# Patient Record
Sex: Male | Born: 1980 | Race: White | Hispanic: No | Marital: Married | Smoking: Light tobacco smoker
Health system: Southern US, Community
[De-identification: ages and names within clinical notes are randomized; demographics above are authoritative.]

## PROBLEM LIST (undated history)

## (undated) DIAGNOSIS — G4730 Sleep apnea, unspecified: Secondary | ICD-10-CM

## (undated) DIAGNOSIS — F32A Depression, unspecified: Secondary | ICD-10-CM

## (undated) DIAGNOSIS — R569 Unspecified convulsions: Secondary | ICD-10-CM

## (undated) DIAGNOSIS — F419 Anxiety disorder, unspecified: Secondary | ICD-10-CM

## (undated) DIAGNOSIS — K219 Gastro-esophageal reflux disease without esophagitis: Secondary | ICD-10-CM

## (undated) DIAGNOSIS — F329 Major depressive disorder, single episode, unspecified: Secondary | ICD-10-CM

## (undated) HISTORY — DX: Depression, unspecified: F32.A

## (undated) HISTORY — PX: OTHER SURGICAL HISTORY: SHX169

## (undated) HISTORY — DX: Major depressive disorder, single episode, unspecified: F32.9

## (undated) HISTORY — DX: Anxiety disorder, unspecified: F41.9

## (undated) HISTORY — DX: Gastro-esophageal reflux disease without esophagitis: K21.9

## (undated) HISTORY — DX: Unspecified convulsions: R56.9

## (undated) HISTORY — DX: Sleep apnea, unspecified: G47.30

---

## 2001-07-09 ENCOUNTER — Emergency Department (HOSPITAL_COMMUNITY): Admission: EM | Admit: 2001-07-09 | Discharge: 2001-07-09 | Payer: Self-pay | Admitting: Emergency Medicine

## 2001-07-09 ENCOUNTER — Encounter: Payer: Self-pay | Admitting: Emergency Medicine

## 2002-11-09 ENCOUNTER — Emergency Department (HOSPITAL_COMMUNITY): Admission: EM | Admit: 2002-11-09 | Discharge: 2002-11-09 | Payer: Self-pay | Admitting: Emergency Medicine

## 2002-11-09 ENCOUNTER — Encounter: Payer: Self-pay | Admitting: Emergency Medicine

## 2002-11-11 ENCOUNTER — Emergency Department (HOSPITAL_COMMUNITY): Admission: EM | Admit: 2002-11-11 | Discharge: 2002-11-11 | Payer: Self-pay | Admitting: Emergency Medicine

## 2006-08-09 ENCOUNTER — Emergency Department (HOSPITAL_COMMUNITY): Admission: EM | Admit: 2006-08-09 | Discharge: 2006-08-09 | Payer: Self-pay | Admitting: Emergency Medicine

## 2006-12-24 ENCOUNTER — Ambulatory Visit (HOSPITAL_COMMUNITY): Payer: Self-pay | Admitting: Psychiatry

## 2008-03-02 ENCOUNTER — Ambulatory Visit (HOSPITAL_COMMUNITY): Payer: Self-pay | Admitting: Psychiatry

## 2009-09-13 ENCOUNTER — Ambulatory Visit (HOSPITAL_COMMUNITY): Payer: Self-pay | Admitting: Psychiatry

## 2010-02-12 ENCOUNTER — Ambulatory Visit: Payer: Self-pay | Admitting: Psychiatry

## 2013-08-13 ENCOUNTER — Emergency Department (HOSPITAL_COMMUNITY): Payer: Self-pay

## 2013-08-13 ENCOUNTER — Emergency Department (HOSPITAL_COMMUNITY)
Admission: EM | Admit: 2013-08-13 | Discharge: 2013-08-13 | Disposition: A | Discharge status: Home / Self Care | Payer: Self-pay | Attending: Emergency Medicine | Admitting: Emergency Medicine

## 2013-08-13 DIAGNOSIS — R1084 Generalized abdominal pain: Secondary | ICD-10-CM | POA: Insufficient documentation

## 2013-08-13 DIAGNOSIS — R109 Unspecified abdominal pain: Secondary | ICD-10-CM

## 2013-08-13 DIAGNOSIS — R112 Nausea with vomiting, unspecified: Secondary | ICD-10-CM | POA: Insufficient documentation

## 2013-08-13 LAB — RAPID URINE DRUG SCREEN, HOSP PERFORMED
Amphetamines: NOT DETECTED
Barbiturates: NOT DETECTED
Benzodiazepines: NOT DETECTED
Cocaine: NOT DETECTED
Tetrahydrocannabinol: POSITIVE — AB

## 2013-08-13 LAB — CBC WITH DIFFERENTIAL/PLATELET
Eosinophils Absolute: 0 10*3/uL (ref 0.0–0.7)
Lymphocytes Relative: 15 % (ref 12–46)
Lymphs Abs: 1.8 10*3/uL (ref 0.7–4.0)
MCH: 30.2 pg (ref 26.0–34.0)
Neutrophils Relative %: 76 % (ref 43–77)
Platelets: 277 10*3/uL (ref 150–400)
RBC: 5.53 MIL/uL (ref 4.22–5.81)
WBC: 11.6 10*3/uL — ABNORMAL HIGH (ref 4.0–10.5)

## 2013-08-13 LAB — COMPREHENSIVE METABOLIC PANEL
ALT: 37 U/L (ref 0–53)
AST: 33 U/L (ref 0–37)
Alkaline Phosphatase: 82 U/L (ref 39–117)
CO2: 24 mEq/L (ref 19–32)
Calcium: 10.4 mg/dL (ref 8.4–10.5)
Creatinine, Ser: 1.16 mg/dL (ref 0.50–1.35)
GFR calc Af Amer: >90 mL/min (ref >90)
Glucose, Bld: 129 mg/dL — ABNORMAL HIGH (ref 70–99)
Potassium: 2.9 mEq/L — ABNORMAL LOW (ref 3.5–5.1)
Sodium: 137 mEq/L (ref 135–145)
Total Protein: 8.5 g/dL — ABNORMAL HIGH (ref 6.0–8.3)

## 2013-08-13 LAB — URINALYSIS, ROUTINE W REFLEX MICROSCOPIC
Bilirubin Urine: NEGATIVE
Glucose, UA: NEGATIVE mg/dL
Hgb urine dipstick: NEGATIVE
Protein, ur: NEGATIVE mg/dL
Specific Gravity, Urine: 1.019 (ref 1.005–1.030)
pH: 7.5 (ref 5.0–8.0)

## 2013-08-13 MED ORDER — IOHEXOL 300 MG/ML  SOLN
50.0000 mL | Freq: Once | INTRAMUSCULAR | Status: AC | PRN
  Administered 2013-08-13: 50 mL via ORAL

## 2013-08-13 MED ORDER — PANTOPRAZOLE SODIUM 40 MG IV SOLR
40.0000 mg | Freq: Once | INTRAVENOUS | Status: AC
  Administered 2013-08-13: 40 mg via INTRAVENOUS
  Filled 2013-08-13 (×2): qty 40

## 2013-08-13 MED ORDER — LORAZEPAM 2 MG/ML IJ SOLN
1.0000 mg | Freq: Once | INTRAMUSCULAR | Status: AC
  Administered 2013-08-13: 1 mg via INTRAVENOUS
  Filled 2013-08-13: qty 1

## 2013-08-13 MED ORDER — PANTOPRAZOLE SODIUM 20 MG PO TBEC: 20.0000 mg | DELAYED_RELEASE_TABLET | Freq: Every day | ORAL | Status: DC

## 2013-08-13 MED ORDER — ONDANSETRON HCL 4 MG/2ML IJ SOLN
4.0000 mg | Freq: Once | INTRAMUSCULAR | Status: AC
  Administered 2013-08-13: 4 mg via INTRAVENOUS
  Filled 2013-08-13: qty 2

## 2013-08-13 MED ORDER — SODIUM CHLORIDE 0.9 % IV BOLUS (SEPSIS)
1000.0000 mL | Freq: Once | INTRAVENOUS | Status: AC
  Administered 2013-08-13: 1000 mL via INTRAVENOUS

## 2013-08-13 MED ORDER — POTASSIUM CHLORIDE 10 MEQ/100ML IV SOLN
10.0000 meq | Freq: Once | INTRAVENOUS | Status: AC
  Administered 2013-08-13: 10 meq via INTRAVENOUS
  Filled 2013-08-13: qty 100

## 2013-08-13 MED ORDER — SODIUM CHLORIDE 0.9 % IV SOLN
1000.0000 mL | Freq: Once | INTRAVENOUS | Status: AC
  Administered 2013-08-13: 1000 mL via INTRAVENOUS

## 2013-08-13 MED ORDER — SUCRALFATE 1 G PO TABS: 1.0000 g | ORAL_TABLET | Freq: Four times a day (QID) | ORAL | Status: DC

## 2013-08-13 MED ORDER — PANTOPRAZOLE SODIUM 40 MG IV SOLR: 40.0000 mg | Freq: Once | INTRAVENOUS | Status: DC

## 2013-08-13 MED ORDER — IOHEXOL 300 MG/ML  SOLN
80.0000 mL | Freq: Once | INTRAMUSCULAR | Status: AC | PRN
  Administered 2013-08-13: 80 mL via INTRAVENOUS

## 2013-08-13 MED ORDER — LORAZEPAM 1 MG PO TABS: 1.0000 mg | ORAL_TABLET | Freq: Three times a day (TID) | ORAL | Status: DC | PRN

## 2013-08-13 MED ORDER — ONDANSETRON 8 MG PO TBDP: 8.0000 mg | ORAL_TABLET | Freq: Three times a day (TID) | ORAL | Status: DC | PRN

## 2013-08-13 NOTE — ED Notes (Signed)
Pt aware of need for urine  

## 2013-08-13 NOTE — ED Notes (Signed)
Pt start having vomiting constantly for 3 days. Anxious not sleeping urgent care yesterday. No releif

## 2013-08-13 NOTE — ED Notes (Signed)
Pt reminded of need for urine 

## 2013-08-13 NOTE — ED Provider Notes (Addendum)
CSN: [id number]     Arrival date & time 08/13/13  1123 History   First MD Initiated Contact with Patient 08/13/13 1208     Chief Complaint  Patient presents with  . Emesis   (Consider location/radiation/quality/duration/timing/severity/associated sxs/prior Treatment) Patient is a 32 y.o. male presenting with vomiting. The history is provided by the patient.  Emesis  patient complains of 3 days of nonbilious emesis with associated nausea and abdominal cramping characterized as diffuse. Denies any fever or chills. No diarrhea noted. No pressure same. Denies any urinary symptoms. Symptoms began spontaneously and nothing makes them better or worse. No treatment used prior to arrival. He does note increased anxiety.  No past medical history on file. No past surgical history on file. No family history on file. History  Substance Use Topics  . Smoking status: Not on file  . Smokeless tobacco: Not on file  . Alcohol Use: Not on file    Review of Systems  Gastrointestinal: Positive for vomiting.  All other systems reviewed and are negative.    Allergies  Review of patient's allergies indicates no known allergies.  Home Medications   Current Outpatient Rx  Name  Route  Sig  Dispense  Refill  . bismuth subsalicylate (PEPTO BISMOL) 262 MG/15ML suspension   Oral   Take 30 mLs by mouth every 6 (six) hours as needed for indigestion.         . diphenhydrAMINE (BENADRYL) 25 MG tablet   Oral   Take 25 mg by mouth every 6 (six) hours as needed for allergies.         Marland Kitchen PRESCRIPTION MEDICATION   Oral   Take 1 tablet by mouth every 6 (six) hours as needed (nausea). Nausea medication          BP 133/96  Pulse 93  Temp(Src) 97.5 F (36.4 C) (Oral)  Resp 22  SpO2 99% Physical Exam  Nursing note and vitals reviewed. Constitutional: He is oriented to person, place, and time. He appears well-developed and well-nourished.  Non-toxic appearance. No distress.  HENT:  Head:  Normocephalic and atraumatic.  Eyes: Conjunctivae, EOM and lids are normal. Pupils are equal, round, and reactive to light.  Neck: Normal range of motion. Neck supple. No tracheal deviation present. No mass present.  Cardiovascular: Normal rate, regular rhythm and normal heart sounds.  Exam reveals no gallop.   No murmur heard. Pulmonary/Chest: Effort normal and breath sounds normal. No stridor. No respiratory distress. He has no decreased breath sounds. He has no wheezes. He has no rhonchi. He has no rales.  Abdominal: Soft. Normal appearance and bowel sounds are normal. He exhibits no distension. There is generalized tenderness. There is no rigidity, no rebound, no guarding and no CVA tenderness.  Musculoskeletal: Normal range of motion. He exhibits no edema and no tenderness.  Neurological: He is alert and oriented to person, place, and time. He has normal strength. No cranial nerve deficit or sensory deficit. GCS eye subscore is 4. GCS verbal subscore is 5. GCS motor subscore is 6.  Skin: Skin is warm and dry. No abrasion and no rash noted.  Psychiatric: His behavior is normal. His mood appears anxious. His speech is rapid and/or pressured.    ED Course  Procedures (including critical care time) Labs Review Labs Reviewed  CBC WITH DIFFERENTIAL  COMPREHENSIVE METABOLIC PANEL  LIPASE, BLOOD  URINALYSIS, ROUTINE W REFLEX MICROSCOPIC   Imaging Review No results found.  EKG Interpretation   None  MDM  No diagnosis found. Patient given IV fluids along with antibiotics Ativan. He feels better at this time. Patient relates symptoms of gastritis and will be treated as such  4:24 PM Spoke with family and discussed need for f/u--pt given meds here for nausea/vomiting, no surgical emergency, suspect PUD with anxiety--will also tx with protonix iv  Toy Baker, MD 08/13/13 1545  Toy Baker, MD 08/13/13 1626

## 2013-08-13 NOTE — Progress Notes (Signed)
   CARE MANAGEMENT ED NOTE 08/13/2013  Patient:  Jose Romero, Jose Romero   Account Number:  [id number]  Date Initiated:  08/13/2013  Documentation initiated by:  Edd Arbour  Subjective/Objective Assessment:   32 year old self pay [place] pt who confirmed with CM he does not have a pcp UDS positive for marijuana K 2.9 wbc 11.6 abdominal xray/CT abdomen without abnormalities VSS     Subjective/Objective Assessment Detail:     Action/Plan:   CM spoke with pt early in ED visit but he was noted shaking so blankets x 2 provided as his ED RN continued to evaluate the pt Cm return to see pt and provided information/resources to mother at bedside see notes below   Action/Plan Detail:   Anticipated DC Date:  08/13/2013     Status Recommendation to Physician:   Result of Recommendation:    Other ED Services  Consult Working Plan    DC Planning Services  PCP issues  Outpatient Services - Pt will follow up  Other    Choice offered to / List presented to:            Status of service:  Completed, signed off  ED Comments:   ED Comments Detail:  CM spoke with pt who confirms self pay [facility] resident with no pcp. CM discussed and provided written information for self pay pcps, importance of pcp for f/u care, www.needymeds.org, discounted pharmacies and other [place] resources such as financial assistance, DSS and  health department  Reviewed resources for [employer] self pay pcps like [facility], family medicine at [address], [facility] family practice, general medical clinics, [facility] urgent care plus others, [facility] out patient pharmacies and housing Pt voiced understanding and appreciation of resources provided

## 2014-04-26 ENCOUNTER — Emergency Department (HOSPITAL_COMMUNITY)
Admission: EM | Admit: 2014-04-26 | Discharge: 2014-04-26 | Disposition: A | Discharge status: Home / Self Care | Payer: BC Managed Care – PPO | Attending: Emergency Medicine | Admitting: Emergency Medicine

## 2014-04-26 ENCOUNTER — Ambulatory Visit (HOSPITAL_COMMUNITY): Admission: RE | Admit: 2014-04-26 | Payer: BC Managed Care – PPO | Source: Ambulatory Visit

## 2014-04-26 ENCOUNTER — Emergency Department (HOSPITAL_COMMUNITY): Payer: BC Managed Care – PPO

## 2014-04-26 ENCOUNTER — Ambulatory Visit (HOSPITAL_COMMUNITY): Payer: BC Managed Care – PPO

## 2014-04-26 ENCOUNTER — Encounter (HOSPITAL_COMMUNITY): Payer: Self-pay | Admitting: Emergency Medicine

## 2014-04-26 DIAGNOSIS — R5383 Other fatigue: Secondary | ICD-10-CM

## 2014-04-26 DIAGNOSIS — R112 Nausea with vomiting, unspecified: Secondary | ICD-10-CM

## 2014-04-26 DIAGNOSIS — R5381 Other malaise: Secondary | ICD-10-CM | POA: Diagnosis not present

## 2014-04-26 DIAGNOSIS — Z79899 Other long term (current) drug therapy: Secondary | ICD-10-CM | POA: Diagnosis not present

## 2014-04-26 DIAGNOSIS — R109 Unspecified abdominal pain: Secondary | ICD-10-CM | POA: Insufficient documentation

## 2014-04-26 LAB — COMPREHENSIVE METABOLIC PANEL
ALT: 50 U/L (ref 0–53)
AST: 28 U/L (ref 0–37)
Albumin: 4.9 g/dL (ref 3.5–5.2)
Alkaline Phosphatase: 79 U/L (ref 39–117)
Anion gap: 21 — ABNORMAL HIGH (ref 5–15)
BUN: 17 mg/dL (ref 6–23)
CALCIUM: 10 mg/dL (ref 8.4–10.5)
CO2: 20 meq/L (ref 19–32)
Chloride: 100 mEq/L (ref 96–112)
Creatinine, Ser: 1.05 mg/dL (ref 0.50–1.35)
GLUCOSE: 146 mg/dL — AB (ref 70–99)
Potassium: 3.4 mEq/L — ABNORMAL LOW (ref 3.7–5.3)
SODIUM: 141 meq/L (ref 137–147)
Total Bilirubin: 1.1 mg/dL (ref 0.3–1.2)
Total Protein: 8.1 g/dL (ref 6.0–8.3)

## 2014-04-26 LAB — RAPID URINE DRUG SCREEN, HOSP PERFORMED
Amphetamines: NOT DETECTED
Barbiturates: NOT DETECTED
Benzodiazepines: NOT DETECTED
Cocaine: NOT DETECTED
Opiates: NOT DETECTED
Tetrahydrocannabinol: POSITIVE — AB

## 2014-04-26 LAB — URINALYSIS, ROUTINE W REFLEX MICROSCOPIC
Glucose, UA: NEGATIVE mg/dL
HGB URINE DIPSTICK: NEGATIVE
KETONES UR: 15 mg/dL — AB
LEUKOCYTES UA: NEGATIVE
Nitrite: NEGATIVE
PROTEIN: 30 mg/dL — AB
Specific Gravity, Urine: 1.03 (ref 1.005–1.030)
Urobilinogen, UA: 1 mg/dL (ref 0.0–1.0)
pH: 6.5 (ref 5.0–8.0)

## 2014-04-26 LAB — CBC WITH DIFFERENTIAL/PLATELET
Basophils Absolute: 0 10*3/uL (ref 0.0–0.1)
Basophils Relative: 0 % (ref 0–1)
EOS PCT: 0 % (ref 0–5)
Eosinophils Absolute: 0 10*3/uL (ref 0.0–0.7)
HCT: 47 % (ref 39.0–52.0)
HEMOGLOBIN: 16.4 g/dL (ref 13.0–17.0)
LYMPHS ABS: 1.5 10*3/uL (ref 0.7–4.0)
LYMPHS PCT: 10 % — AB (ref 12–46)
MCH: 29.3 pg (ref 26.0–34.0)
MCHC: 34.9 g/dL (ref 30.0–36.0)
MCV: 84.1 fL (ref 78.0–100.0)
MONOS PCT: 4 % (ref 3–12)
Monocytes Absolute: 0.6 10*3/uL (ref 0.1–1.0)
Neutro Abs: 12.4 10*3/uL — ABNORMAL HIGH (ref 1.7–7.7)
Neutrophils Relative %: 86 % — ABNORMAL HIGH (ref 43–77)
Platelets: 263 10*3/uL (ref 150–400)
RBC: 5.59 MIL/uL (ref 4.22–5.81)
RDW: 12.2 % (ref 11.5–15.5)
WBC: 14.5 10*3/uL — AB (ref 4.0–10.5)

## 2014-04-26 LAB — URINE MICROSCOPIC-ADD ON

## 2014-04-26 LAB — LIPASE, BLOOD: Lipase: 26 U/L (ref 11–59)

## 2014-04-26 MED ORDER — SODIUM CHLORIDE 0.9 % IV BOLUS (SEPSIS)
500.0000 mL | Freq: Once | INTRAVENOUS | Status: AC
  Administered 2014-04-26: 500 mL via INTRAVENOUS

## 2014-04-26 MED ORDER — PROMETHAZINE HCL 25 MG/ML IJ SOLN
25.0000 mg | Freq: Once | INTRAMUSCULAR | Status: AC
  Administered 2014-04-26: 25 mg via INTRAVENOUS
  Filled 2014-04-26 (×2): qty 1

## 2014-04-26 MED ORDER — LORAZEPAM 2 MG/ML IJ SOLN
1.0000 mg | Freq: Once | INTRAMUSCULAR | Status: AC
  Administered 2014-04-26: 1 mg via INTRAVENOUS
  Filled 2014-04-26: qty 1

## 2014-04-26 MED ORDER — ONDANSETRON 8 MG PO TBDP
8.0000 mg | ORAL_TABLET | Freq: Once | ORAL | Status: AC
  Administered 2014-04-26: 8 mg via ORAL
  Filled 2014-04-26: qty 1

## 2014-04-26 MED ORDER — SUCRALFATE 1 G PO TABS: 1.0000 g | ORAL_TABLET | Freq: Three times a day (TID) | ORAL | Status: AC

## 2014-04-26 MED ORDER — SODIUM CHLORIDE 0.9 % IV BOLUS (SEPSIS)
1000.0000 mL | Freq: Once | INTRAVENOUS | Status: AC
  Administered 2014-04-26: 1000 mL via INTRAVENOUS

## 2014-04-26 MED ORDER — ONDANSETRON HCL 4 MG/2ML IJ SOLN
4.0000 mg | Freq: Once | INTRAMUSCULAR | Status: AC
  Administered 2014-04-26: 4 mg via INTRAVENOUS
  Filled 2014-04-26: qty 2

## 2014-04-26 MED ORDER — GI COCKTAIL ~~LOC~~
30.0000 mL | Freq: Once | ORAL | Status: AC
  Administered 2014-04-26: 30 mL via ORAL
  Filled 2014-04-26: qty 30

## 2014-04-26 MED ORDER — HALOPERIDOL LACTATE 5 MG/ML IJ SOLN: 2.5000 mg | Freq: Once | INTRAMUSCULAR | Status: DC

## 2014-04-26 MED ORDER — PANTOPRAZOLE SODIUM 40 MG IV SOLR
40.0000 mg | Freq: Once | INTRAVENOUS | Status: AC
  Administered 2014-04-26: 40 mg via INTRAVENOUS
  Filled 2014-04-26: qty 40

## 2014-04-26 MED ORDER — PROMETHAZINE HCL 25 MG/ML IJ SOLN
25.0000 mg | Freq: Once | INTRAMUSCULAR | Status: AC
  Administered 2014-04-26: 25 mg via INTRAVENOUS
  Filled 2014-04-26: qty 1

## 2014-04-26 MED ORDER — OMEPRAZOLE 20 MG PO CPDR: 20.0000 mg | DELAYED_RELEASE_CAPSULE | Freq: Every day | ORAL | Status: DC

## 2014-04-26 MED ORDER — ONDANSETRON HCL 4 MG PO TABS: 4.0000 mg | ORAL_TABLET | Freq: Four times a day (QID) | ORAL | Status: AC

## 2014-04-26 NOTE — Progress Notes (Signed)
  CARE MANAGEMENT ED NOTE 04/26/2014  Patient:  Jose Romero   Account Number:  [id number]  Date Initiated:  04/26/2014  Documentation initiated by:  Radford Pax  Subjective/Objective Assessment:   Patient presents to Ed with with nausea and vomiting     Subjective/Objective Assessment Detail:     Action/Plan:   Action/Plan Detail:   Anticipated DC Date:       Status Recommendation to Physician:   Result of Recommendation:    Other ED Services  Consult Working Plan    DC Planning Services  Other  PCP issues    Choice offered to / List presented to:            Status of service:  Completed, signed off  ED Comments:   ED Comments Detail:  EDCM spoek to patient at bedside.  Patient confirms he has [employer] insurance without a pcp.  [facility] provided patient with a list of pcps within a 20 mile radius of patient's [place] [address] as per patient.  EDCM also instructed patient to call the phone numer on the back of his insurnace card to help him find a physician who is close to him and within network.  Patient thankful for resources.  No further EDCM needs at this time.

## 2014-04-26 NOTE — ED Notes (Signed)
Pt has had empty grocery bag since getting to room. Pt continues to make loud vomiting noises into bag but bag remains empty.

## 2014-04-26 NOTE — ED Notes (Addendum)
Pt states that he has had nausea/emesis this bad one time before. Also states he has light and sound sensitivity. Family would like to consult with GI. Alert and oriented.

## 2014-04-26 NOTE — ED Provider Notes (Signed)
CSN: [id number]     Arrival date & time 04/26/14  1807 History   First MD Initiated Contact with Patient 04/26/14 1902     Chief Complaint  Patient presents with  . Emesis     (Consider location/radiation/quality/duration/timing/severity/associated sxs/prior Treatment) HPI Comments: Patient complains of 3 day history of nausea, vomiting and dry heaving. Symptoms started after taking alcohol and eating spicy food on Saturday. He had multiple episodes of nausea and vomiting on Sunday. He felt better on Monday. Symptoms returned again today with nausea and vomiting. Denies any abdominal pain or fever. No cough. Similar symptoms in December there were secondary to gastritis. Patient states he drinks daily but doesn't usually have this problem. is not have episodes since he was last seen here in December. he is not followed up with GI. He ran out of his PPI and Carafate. Denies any blood in the emesis. Denies any diarrhea. Denies any excessive NSAID use.   The history is provided by the patient.    History reviewed. No pertinent past medical history. No past surgical history on file. No family history on file. History  Substance Use Topics  . Smoking status: Not on file  . Smokeless tobacco: Not on file  . Alcohol Use: Yes     Comment: everyday    Review of Systems  Constitutional: Positive for activity change, appetite change and fatigue. Negative for fever.  HENT: Negative for congestion and rhinorrhea.   Eyes: Negative for visual disturbance.  Respiratory: Negative for cough, chest tightness and shortness of breath.   Cardiovascular: Negative for chest pain.  Gastrointestinal: Positive for nausea, vomiting and abdominal pain. Negative for diarrhea.  Genitourinary: Negative for dysuria and hematuria.  Musculoskeletal: Negative for arthralgias and myalgias.  Skin: Negative for wound.  Neurological: Negative for dizziness, weakness and headaches.  A complete 10 system review of  systems was obtained and all systems are negative except as noted in the HPI and PMH.      Allergies  Review of patient's allergies indicates no known allergies.  Home Medications   Prior to Admission medications   Medication Sig Start Date End Date Taking? Authorizing Provider  LORAZEPAM PO Take by mouth.   Yes Historical Provider, MD  omeprazole (PRILOSEC) 20 MG capsule Take 1 capsule (20 mg total) by mouth daily. 04/26/14   Glynn Octave, MD  ondansetron (ZOFRAN) 4 MG tablet Take 1 tablet (4 mg total) by mouth every 6 (six) hours. 04/26/14   Glynn Octave, MD  sucralfate (CARAFATE) 1 G tablet Take 1 tablet (1 g total) by mouth 4 (four) times daily -  with meals and at bedtime. 04/26/14   Glynn Octave, MD   BP 138/98  Pulse 62  SpO2 100% Physical Exam  Nursing note and vitals reviewed. Constitutional: He is oriented to person, place, and time. He appears well-developed and well-nourished. He appears distressed.  Dramatic dry heaving  HENT:  Head: Normocephalic and atraumatic.  Mouth/Throat: Oropharynx is clear and moist. No oropharyngeal exudate.  Eyes: Conjunctivae and EOM are normal. Pupils are equal, round, and reactive to light.  Neck: Normal range of motion. Neck supple.  No meningismus.  Cardiovascular: Normal rate, regular rhythm, normal heart sounds and intact distal pulses.   No murmur heard. Pulmonary/Chest: Effort normal and breath sounds normal. No respiratory distress.  Abdominal: Soft. There is no tenderness. There is no rebound and no guarding.  Completely nontender  Musculoskeletal: Normal range of motion. He exhibits no edema and no tenderness.  Neurological: He is alert and oriented to person, place, and time. No cranial nerve deficit. He exhibits normal muscle tone. Coordination normal.  No ataxia on finger to nose bilaterally. No pronator drift. 5/5 strength throughout. CN 2-12 intact. Negative Romberg. Equal grip strength. Sensation intact. Gait is  normal.   Skin: Skin is warm.  Psychiatric: He has a normal mood and affect. His behavior is normal.    ED Course  Procedures (including critical care time) Labs Review Labs Reviewed  CBC WITH DIFFERENTIAL - Abnormal; Notable for the following:    WBC 14.5 (*)    Neutrophils Relative % 86 (*)    Neutro Abs 12.4 (*)    Lymphocytes Relative 10 (*)    All other components within normal limits  COMPREHENSIVE METABOLIC PANEL - Abnormal; Notable for the following:    Potassium 3.4 (*)    Glucose, Bld 146 (*)    Anion gap 21 (*)    All other components within normal limits  URINALYSIS, ROUTINE W REFLEX MICROSCOPIC - Abnormal; Notable for the following:    Color, Urine AMBER (*)    Bilirubin Urine SMALL (*)    Ketones, ur 15 (*)    Protein, ur 30 (*)    All other components within normal limits  URINE MICROSCOPIC-ADD ON - Abnormal; Notable for the following:    Bacteria, UA FEW (*)    All other components within normal limits  URINE RAPID DRUG SCREEN (HOSP PERFORMED) - Abnormal; Notable for the following:    Tetrahydrocannabinol POSITIVE (*)    All other components within normal limits  LIPASE, BLOOD    Imaging Review No results found.   EKG Interpretation None      MDM   Final diagnoses:  Nausea and vomiting, vomiting of unspecified type   Nausea, vomiting similar to previous episodes of gastritis. Abdomen soft without peritoneal signs. IV fluids, PPI, antiemetics, Zofran, Ativan  Patient and wife declined x-ray or other imaging. CT scan December was on remarkable. They are worried about cost. Patient has a soft abdomen with no guarding or rebound. Leukocytosis is likely reactive. No pain at [place] point AG 21, sugar 146.  No history of DM.  Small ketones in urine.  Doubt DKA. Suspect anion gap due to dehydration.  Dry heaving has improved.  Abdomen soft and nontender.  Tolerating PO.  Counseled patient on refraining from alcohol, marijuana, tobacco, caffeine,  NSAIDS Restart PPI and carafate.  followup with GI.  Return precautions discussed.  BP 138/98  Pulse 62  SpO2 100%   Glynn Octave, MD 04/27/14 [phone]

## 2014-04-26 NOTE — Discharge Instructions (Signed)
Nausea and Vomiting Stop using alcohol and marijuana. Follow up with the GI doctor. Return to the ED if you develop new or worsening symptoms. Nausea is a sick feeling that often comes before throwing up (vomiting). Vomiting is a reflex where stomach contents come out of your mouth. Vomiting can cause severe loss of body fluids (dehydration). Children and elderly adults can become dehydrated quickly, especially if they also have diarrhea. Nausea and vomiting are symptoms of a condition or disease. It is important to find the cause of your symptoms. CAUSES   Direct irritation of the stomach lining. This irritation can result from increased acid production (gastroesophageal reflux disease), infection, food poisoning, taking certain medicines (such as nonsteroidal anti-inflammatory drugs), alcohol use, or tobacco use.  Signals from the brain.These signals could be caused by a headache, heat exposure, an inner ear disturbance, increased pressure in the brain from injury, infection, a tumor, or a concussion, pain, emotional stimulus, or metabolic problems.  An obstruction in the gastrointestinal tract (bowel obstruction).  Illnesses such as diabetes, hepatitis, gallbladder problems, appendicitis, kidney problems, cancer, sepsis, atypical symptoms of a heart attack, or eating disorders.  Medical treatments such as chemotherapy and radiation.  Receiving medicine that makes you sleep (general anesthetic) during surgery. DIAGNOSIS Your caregiver may ask for tests to be done if the problems do not improve after a few days. Tests may also be done if symptoms are severe or if the reason for the nausea and vomiting is not clear. Tests may include:  Urine tests.  Blood tests.  Stool tests.  Cultures (to look for evidence of infection).  X-rays or other imaging studies. Test results can help your caregiver make decisions about treatment or the need for additional tests. TREATMENT You need to stay  well hydrated. Drink frequently but in small amounts.You may wish to drink water, sports drinks, clear broth, or eat frozen ice pops or gelatin dessert to help stay hydrated.When you eat, eating slowly may help prevent nausea.There are also some antinausea medicines that may help prevent nausea. HOME CARE INSTRUCTIONS   Take all medicine as directed by your caregiver.  If you do not have an appetite, do not force yourself to eat. However, you must continue to drink fluids.  If you have an appetite, eat a normal diet unless your caregiver tells you differently.  Eat a variety of complex carbohydrates (rice, wheat, potatoes, bread), lean meats, yogurt, fruits, and vegetables.  Avoid high-fat foods because they are more difficult to digest.  Drink enough water and fluids to keep your urine clear or pale yellow.  If you are dehydrated, ask your caregiver for specific rehydration instructions. Signs of dehydration may include:  Severe thirst.  Dry lips and mouth.  Dizziness.  Dark urine.  Decreasing urine frequency and amount.  Confusion.  Rapid breathing or pulse. SEEK IMMEDIATE MEDICAL CARE IF:   You have blood or brown flecks (like coffee grounds) in your vomit.  You have black or bloody stools.  You have a severe headache or stiff neck.  You are confused.  You have severe abdominal pain.  You have chest pain or trouble breathing.  You do not urinate at least once every 8 hours.  You develop cold or clammy skin.  You continue to vomit for longer than 24 to 48 hours.  You have a fever. MAKE SURE YOU:   Understand these instructions.  Will watch your condition.  Will get help right away if you are not doing well or  get worse. Document Released: 08/19/2005 Document Revised: 11/11/2011 Document Reviewed: 01/16/2011 [facility] Patient Information 2015 [place], [place]. This information is not intended to replace advice given to you by your health care provider.  Make sure you discuss any questions you have with your health care provider.

## 2014-05-05 ENCOUNTER — Encounter: Payer: Self-pay | Admitting: Medical

## 2014-05-05 ENCOUNTER — Ambulatory Visit (INDEPENDENT_AMBULATORY_CARE_PROVIDER_SITE_OTHER): Payer: BC Managed Care – PPO | Admitting: Medical

## 2014-05-05 VITALS — BP 120/90 | HR 73 | Temp 98.7°F | Ht 60.5 in | Wt 182.4 lb

## 2014-05-05 DIAGNOSIS — R030 Elevated blood-pressure reading, without diagnosis of hypertension: Secondary | ICD-10-CM

## 2014-05-05 DIAGNOSIS — K219 Gastro-esophageal reflux disease without esophagitis: Secondary | ICD-10-CM | POA: Insufficient documentation

## 2014-05-05 DIAGNOSIS — F3289 Other specified depressive episodes: Secondary | ICD-10-CM

## 2014-05-05 DIAGNOSIS — F329 Major depressive disorder, single episode, unspecified: Secondary | ICD-10-CM

## 2014-05-05 DIAGNOSIS — F411 Generalized anxiety disorder: Secondary | ICD-10-CM

## 2014-05-05 DIAGNOSIS — F32A Depression, unspecified: Secondary | ICD-10-CM

## 2014-05-05 DIAGNOSIS — R251 Tremor, unspecified: Secondary | ICD-10-CM | POA: Insufficient documentation

## 2014-05-05 DIAGNOSIS — R259 Unspecified abnormal involuntary movements: Secondary | ICD-10-CM

## 2014-05-05 HISTORY — DX: Gastro-esophageal reflux disease without esophagitis: K21.9

## 2014-05-05 MED ORDER — OMEPRAZOLE 20 MG PO CPDR: 20.0000 mg | DELAYED_RELEASE_CAPSULE | Freq: Every day | ORAL | Status: AC

## 2014-05-05 MED ORDER — OMEPRAZOLE 20 MG PO CPDR
20.0000 mg | DELAYED_RELEASE_CAPSULE | Freq: Every day | ORAL | Status: DC
Start: 2014-05-05 — End: 2014-05-05

## 2014-05-05 MED ORDER — SERTRALINE HCL 50 MG PO TABS: 50.0000 mg | ORAL_TABLET | Freq: Every day | ORAL | Status: DC

## 2014-05-05 NOTE — Assessment & Plan Note (Signed)
I want you to check your blood pressure daily for the next 2 weeks. Document those readings and bring log in for followup in 2 weeks. Follow DASH diet between now and then.

## 2014-05-05 NOTE — Assessment & Plan Note (Signed)
Sertraline prescription. Patient has used benzodiazepines in the past and he states none for some time now. I will see him back in 2 weeks and see if SSRI is adequate. We'll consider sparing use of clonazepam if needed. But this would be instructed for only anxiety attacks/panic.

## 2014-05-05 NOTE — Assessment & Plan Note (Signed)
Patient's trembling/shaking has been a chronic problem for years. He has some features of feeling as if he gets a head rush when this occurs and having transient blackout visual fields. This has not changed since he was a youth. These events only lasts 15 seconds. Will resend him back to neurologist for probable EEG. If the symptoms worsen with more prolonged symptoms then patient can be see at the emergency department.

## 2014-05-05 NOTE — Assessment & Plan Note (Signed)
Rx of Prilosec today. GERD diet information given. If his symptoms persist with no improvement consider getting Helicobacter pylori studies in the future.

## 2014-05-05 NOTE — Assessment & Plan Note (Signed)
He admits some depression but anxiety is the most predominant issue.

## 2014-05-05 NOTE — Patient Instructions (Addendum)
For your anxiety and depression I am prescribing sertraline. For you transient almost daily shaking episodes I am referring you to neurology. For you borderline bp I want you to check bp daily and record readings(may rx med in near future). I sent omeprazole to your pharmacy for gerd. I want you to schedule CPE in 2wks and come in fasting.   DASH Eating Plan DASH stands for "Dietary Approaches to Stop Hypertension." The DASH eating plan is a healthy eating plan that has been shown to reduce high blood pressure (hypertension). Additional health benefits may include reducing the risk of type 2 diabetes mellitus, heart disease, and stroke. The DASH eating plan may also help with weight loss. WHAT DO I NEED TO KNOW ABOUT THE DASH EATING PLAN? For the DASH eating plan, you will follow these general guidelines:  Choose foods with a percent daily value for sodium of less than 5% (as listed on the food label).  Use salt-free seasonings or herbs instead of table salt or sea salt.  Check with your health care provider or pharmacist before using salt substitutes.  Eat lower-sodium products, often labeled as "lower sodium" or "no salt added."  Eat fresh foods.  Eat more vegetables, fruits, and low-fat dairy products.  Choose whole grains. Look for the word "whole" as the first word in the ingredient list.  Choose fish and skinless chicken or [place] more often than red meat. Limit fish, poultry, and meat to 6 oz (170 g) each day.  Limit sweets, desserts, sugars, and sugary drinks.  Choose heart-healthy fats.  Limit cheese to 1 oz (28 g) per day.  Eat more home-cooked food and less restaurant, buffet, and fast food.  Limit fried foods.  Cook foods using methods other than frying.  Limit canned vegetables. If you do use them, rinse them well to decrease the sodium.  When eating at a restaurant, ask that your food be prepared with less salt, or no salt if possible. WHAT FOODS CAN I EAT? Seek  help from a dietitian for individual calorie needs. Grains Whole grain or whole wheat bread. Brown rice. Whole grain or whole wheat pasta. Quinoa, bulgur, and whole grain cereals. Low-sodium cereals. Corn or whole wheat flour tortillas. Whole grain cornbread. Whole grain crackers. Low-sodium crackers. Vegetables Fresh or frozen vegetables (raw, steamed, roasted, or grilled). Low-sodium or reduced-sodium tomato and vegetable juices. Low-sodium or reduced-sodium tomato sauce and paste. Low-sodium or reduced-sodium canned vegetables.  Fruits All fresh, canned (in natural juice), or frozen fruits. Meat and Other Protein Products Ground beef (85% or leaner), grass-fed beef, or beef trimmed of fat. Skinless chicken or [place]. Ground chicken or [place]. Pork trimmed of fat. All fish and seafood. Eggs. Dried beans, peas, or lentils. Unsalted nuts and seeds. Unsalted canned beans. Dairy Low-fat dairy products, such as skim or 1% milk, 2% or reduced-fat cheeses, low-fat ricotta or cottage cheese, or plain low-fat yogurt. Low-sodium or reduced-sodium cheeses. Fats and Oils Tub margarines without trans fats. Light or reduced-fat mayonnaise and salad dressings (reduced sodium). Avocado. Safflower, olive, or canola oils. Natural peanut or almond butter. Other Unsalted popcorn and pretzels. The items listed above may not be a complete list of recommended foods or beverages. Contact your dietitian for more options. WHAT FOODS ARE NOT RECOMMENDED? Grains White bread. White pasta. White rice. Refined cornbread. Bagels and croissants. Crackers that contain trans fat. Vegetables Creamed or fried vegetables. Vegetables in a cheese sauce. Regular canned vegetables. Regular canned tomato sauce and paste. Regular tomato  and vegetable juices. Fruits Dried fruits. Canned fruit in light or heavy syrup. Fruit juice. Meat and Other Protein Products Fatty cuts of meat. Ribs, chicken wings, bacon, sausage, bologna, salami,  chitterlings, fatback, hot dogs, bratwurst, and packaged luncheon meats. Salted nuts and seeds. Canned beans with salt. Dairy Whole or 2% milk, cream, half-and-half, and cream cheese. Whole-fat or sweetened yogurt. Full-fat cheeses or blue cheese. Nondairy creamers and whipped toppings. Processed cheese, cheese spreads, or cheese curds. Condiments Onion and garlic salt, seasoned salt, table salt, and sea salt. Canned and packaged gravies. Worcestershire sauce. Tartar sauce. Barbecue sauce. Teriyaki sauce. Soy sauce, including reduced sodium. Steak sauce. Fish sauce. Oyster sauce. Cocktail sauce. Horseradish. Ketchup and mustard. Meat flavorings and tenderizers. Bouillon cubes. Hot sauce. Tabasco sauce. Marinades. Taco seasonings. Relishes. Fats and Oils Butter, stick margarine, lard, shortening, ghee, and bacon fat. Coconut, palm kernel, or palm oils. Regular salad dressings. Other Pickles and olives. Salted popcorn and pretzels. The items listed above may not be a complete list of foods and beverages to avoid. Contact your dietitian for more information. WHERE CAN I FIND MORE INFORMATION? National Heart, Lung, and Blood Institute: [employer] Document Released: 08/08/2011 Document Revised: 01/03/2014 Document Reviewed: 06/23/2013 [facility] Patient Information 2015 [place], [place]. This information is not intended to replace advice given to you by your health care provider. Make sure you discuss any questions you have with your health care provider.  Gastroesophageal Reflux Disease, Adult Gastroesophageal reflux disease (GERD) happens when acid from your stomach flows up into the esophagus. When acid comes in contact with the esophagus, the acid causes soreness (inflammation) in the esophagus. Over time, GERD may create small holes (ulcers) in the lining of the esophagus. CAUSES   Increased body weight. This puts pressure on the stomach, making acid rise from  the stomach into the esophagus.  Smoking. This increases acid production in the stomach.  Drinking alcohol. This causes decreased pressure in the lower esophageal sphincter (valve or ring of muscle between the esophagus and stomach), allowing acid from the stomach into the esophagus.  Late evening meals and a full stomach. This increases pressure and acid production in the stomach.  A malformed lower esophageal sphincter. Sometimes, no cause is found. SYMPTOMS   Burning pain in the lower part of the mid-chest behind the breastbone and in the mid-stomach area. This may occur twice a week or more often.  Trouble swallowing.  Sore throat.  Dry cough.  Asthma-like symptoms including chest tightness, shortness of breath, or wheezing. DIAGNOSIS  Your caregiver may be able to diagnose GERD based on your symptoms. In some cases, X-rays and other tests may be done to check for complications or to check the condition of your stomach and esophagus. TREATMENT  Your caregiver may recommend over-the-counter or prescription medicines to help decrease acid production. Ask your caregiver before starting or adding any new medicines.  HOME CARE INSTRUCTIONS   Change the factors that you can control. Ask your caregiver for guidance concerning weight loss, quitting smoking, and alcohol consumption.  Avoid foods and drinks that make your symptoms worse, such as:  Caffeine or alcoholic drinks.  Chocolate.  Peppermint or mint flavorings.  Garlic and onions.  Spicy foods.  Citrus fruits, such as oranges, lemons, or limes.  Tomato-based foods such as sauce, chili, salsa, and pizza.  Fried and fatty foods.  Avoid lying down for the 3 hours prior to your bedtime or prior to taking a nap.  Eat small, frequent meals instead of  large meals.  Wear loose-fitting clothing. Do not wear anything tight around your waist that causes pressure on your stomach.  Raise the head of your bed 6 to 8 inches  with wood blocks to help you sleep. Extra pillows will not help.  Only take over-the-counter or prescription medicines for pain, discomfort, or fever as directed by your caregiver.  Do not take aspirin, ibuprofen, or other nonsteroidal anti-inflammatory drugs (NSAIDs). SEEK IMMEDIATE MEDICAL CARE IF:   You have pain in your arms, neck, jaw, teeth, or back.  Your pain increases or changes in intensity or duration.  You develop nausea, vomiting, or sweating (diaphoresis).  You develop shortness of breath, or you faint.  Your vomit is green, yellow, black, or looks like coffee grounds or blood.  Your stool is red, bloody, or black. These symptoms could be signs of other problems, such as heart disease, gastric bleeding, or esophageal bleeding. MAKE SURE YOU:   Understand these instructions.  Will watch your condition.  Will get help right away if you are not doing well or get worse. Document Released: 05/29/2005 Document Revised: 11/11/2011 Document Reviewed: 03/08/2011 [facility] Patient Information 2015 [place], [place]. This information is not intended to replace advice given to you by your health care provider. Make sure you discuss any questions you have with your health care provider.  Note I did discuss with patient that next visit would be wellness type exam. I would make notes regarding update on his current diagnoses today. But next visit would be mostly for fasting lab work.

## 2014-05-05 NOTE — Progress Notes (Signed)
Subjective:    Patient ID: Jose Romero, male    DOB: November 01, 1980, 33 y.o.   MRN: [id number]  HPI New pt. See PMH, PSH,and FH.(Section) Pt drinks 2 cups coffee q day. No exercise. Pt currently unemployed. Grandmother had a stroke and pt taking care of her. 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 Married. 1 daughter 29 yo. Relaxes watching TV and video games.  Pt here to get established he feels depression and feeling anxious. He states attributing to changes in life. He is planning to move to [place]. He is living with mother in her house. He is living with twin sisters. A lot of stress in household. His wife does not want to live with his mother so she went to live with her mother. Pt moving to [facility] for wife education.  Pt was depressed as teenager and was on med. He can't remember type of med. Now feeling more anxious. Previously had been on xanax for years. Dr. Dub Mikes retired. Pt has not been on anything since.  Pt also reports that he has episodes where he feels like he will get extreme headrush with hand shakes. Sometimes hands will twitch. No syncope episodes. When he was young he had neurologic evaluation. He describes EEG and no cause was found. No ha associated. Vision he states goes black but he does not pass out. No muscle soreness afterwards. No incontinence. Last only 15 seconds. No confusion afterwards. He states has almost every day. He can't say for sure that he had one today.  Pt states during cna courses he found his bp was high. His bp was 132/80.   Pt also has history of Gerd. Pt states past 3 years. Recently less. Takes tums. Recently ED gave prilosec. He has some tabs at home.        Review of Systems  Constitutional: Negative for fever, chills and fatigue.  HENT: Negative for congestion, ear pain, nosebleeds, postnasal drip, rhinorrhea, sinus pressure, sore throat and trouble swallowing.   Eyes: Positive for visual disturbance.   Very transient with his head rush episodes.  Respiratory: Negative for cough, shortness of breath and wheezing.        He also reports sometimes he gets anxious very uptight and won't breath deep.  Cardiovascular: Negative for chest pain and palpitations.  Gastrointestinal: Negative.   Musculoskeletal: Negative.   Neurological: Positive for seizures. Negative for dizziness, tremors, syncope, speech difficulty, weakness, light-headedness, numbness and headaches.       Shaking hand episodes with head rush and vision changes. Last 15 seconds.  Psychiatric/Behavioral: Positive for dysphoric mood. Negative for suicidal ideas, hallucinations, behavioral problems, confusion, sleep disturbance, self-injury, decreased concentration and agitation. The patient is nervous/anxious. The patient is not hyperactive.        Objective:   Physical Exam  General Mental Status- Alert. General Appearance- Not in acute distress.   Skin General: Color- Normal Color. Moisture- Normal Moisture.  Neck Carotid Arteries- Normal color. Moisture- Normal Moisture. No carotid bruits. No JVD.  Chest and Lung Exam Auscultation: Breath Sounds:-Normal.  Cardiovascular Auscultation:Rythm- Regular. Murmurs & Other Heart Sounds:Auscultation of the heart reveals- No Murmurs.  Abdomen Inspection:-Inspeection Normal. Palpation/Percussion:Note:No mass. Palpation and Percussion of the abdomen reveal- Non Tender, Non Distended + BS, no rebound or guarding.    Neurologic Cranial Nerve exam:- CN III-XII intact(No nystagmus), symmetric smile. Drift Test:- No drift. Romberg Exam:- Negative.  Heal to Toe Gait exam:-Normal. Finger to Nose:- Normal/Intact Strength:- 5/5 equal  and symmetric strength both upper and lower extremities.            Assessment & Plan:

## 2014-05-17 ENCOUNTER — Telehealth: Payer: Self-pay | Admitting: Medical

## 2014-05-17 NOTE — Telephone Encounter (Signed)
Caller name: Rober  Relation to pt: self  Call back number: [phone]   Reason for call:   Pt would like to discuss the most recent medication you placed him.

## 2014-05-17 NOTE — Telephone Encounter (Signed)
Provided his mood is stable. He can stop medication and will discuss this with him on Thursday. How is his mood? I don't want him to stop abruptly unless he comes in on Thursday and gets appointment successfully rescheduled. Give me update on his appointment reschedule.

## 2014-05-17 NOTE — Telephone Encounter (Signed)
This pt wants to change thursday appointment time. He can reschedule for another day unless the pt at 10:30 am  is willing to come in at 1 pm(His appointment slot). And he could take 10:30 am slot. Or actually schedule for 10 am. It is a physical exam. I was trying to send message to Elliot Gault but could not find her in computer. So could let front desk know.

## 2014-05-17 NOTE — Telephone Encounter (Signed)
Patient states Zoloft is causing him problems with sexual function states he can get an erection but cannot finish . States one of the side effects is weight gain and he doesn't want to do this. Wants to know if he can be placed on another medication doesn't want to continue with Zoloft.

## 2014-05-17 NOTE — Telephone Encounter (Signed)
Please advise pt has a CPE 05/19/14 at 1pm and has to take he's grandmother on appointment. Pt would like to know if you can see him  between 10am - 12noon or after 3pm that day . Please advise

## 2014-05-18 NOTE — Telephone Encounter (Signed)
Patient coming in on 05/20/14 at 2:00 p.m.

## 2014-05-19 ENCOUNTER — Encounter: Payer: BC Managed Care – PPO | Admitting: Medical

## 2014-05-20 ENCOUNTER — Ambulatory Visit (INDEPENDENT_AMBULATORY_CARE_PROVIDER_SITE_OTHER): Payer: BC Managed Care – PPO | Admitting: Medical

## 2014-05-20 ENCOUNTER — Encounter: Payer: Self-pay | Admitting: Medical

## 2014-05-20 ENCOUNTER — Telehealth: Payer: Self-pay | Admitting: Medical

## 2014-05-20 VITALS — BP 124/76 | HR 68 | Temp 98.5°F | Ht 69.75 in | Wt 178.0 lb

## 2014-05-20 DIAGNOSIS — Z23 Encounter for immunization: Secondary | ICD-10-CM

## 2014-05-20 DIAGNOSIS — Z0000 Encounter for general adult medical examination without abnormal findings: Secondary | ICD-10-CM | POA: Insufficient documentation

## 2014-05-20 DIAGNOSIS — R030 Elevated blood-pressure reading, without diagnosis of hypertension: Secondary | ICD-10-CM

## 2014-05-20 DIAGNOSIS — F411 Generalized anxiety disorder: Secondary | ICD-10-CM

## 2014-05-20 DIAGNOSIS — F329 Major depressive disorder, single episode, unspecified: Secondary | ICD-10-CM

## 2014-05-20 DIAGNOSIS — F3289 Other specified depressive episodes: Secondary | ICD-10-CM

## 2014-05-20 DIAGNOSIS — F32A Depression, unspecified: Secondary | ICD-10-CM

## 2014-05-20 LAB — CBC WITH DIFFERENTIAL/PLATELET
Basophils Absolute: 0 10*3/uL (ref 0.0–0.1)
Basophils Relative: 0 % (ref 0–1)
Eosinophils Absolute: 0.1 10*3/uL (ref 0.0–0.7)
Eosinophils Relative: 1 % (ref 0–5)
HEMATOCRIT: 43.4 % (ref 39.0–52.0)
Hemoglobin: 15.1 g/dL (ref 13.0–17.0)
LYMPHS ABS: 1.6 10*3/uL (ref 0.7–4.0)
LYMPHS PCT: 20 % (ref 12–46)
MCH: 29.9 pg (ref 26.0–34.0)
MCHC: 34.8 g/dL (ref 30.0–36.0)
MCV: 85.9 fL (ref 78.0–100.0)
MONO ABS: 0.6 10*3/uL (ref 0.1–1.0)
Monocytes Relative: 7 % (ref 3–12)
Neutro Abs: 5.8 10*3/uL (ref 1.7–7.7)
Neutrophils Relative %: 72 % (ref 43–77)
Platelets: 246 10*3/uL (ref 150–400)
RBC: 5.05 MIL/uL (ref 4.22–5.81)
RDW: 13.6 % (ref 11.5–15.5)
WBC: 8.1 10*3/uL (ref 4.0–10.5)

## 2014-05-20 LAB — LIPID PANEL
Cholesterol: 203 mg/dL — ABNORMAL HIGH (ref 0–200)
HDL: 48 mg/dL (ref >39)
LDL Cholesterol: 127 mg/dL — ABNORMAL HIGH (ref 0–99)
Total CHOL/HDL Ratio: 4.2 Ratio
Triglycerides: 140 mg/dL (ref <150)
VLDL: 28 mg/dL (ref 0–40)

## 2014-05-20 LAB — COMPREHENSIVE METABOLIC PANEL
ALBUMIN: 4.5 g/dL (ref 3.5–5.2)
ALT: 18 U/L (ref 0–53)
AST: 15 U/L (ref 0–37)
Alkaline Phosphatase: 64 U/L (ref 39–117)
BUN: 11 mg/dL (ref 6–23)
CHLORIDE: 106 meq/L (ref 96–112)
CO2: 24 mEq/L (ref 19–32)
Calcium: 9.6 mg/dL (ref 8.4–10.5)
Creat: 0.94 mg/dL (ref 0.50–1.35)
GLUCOSE: 77 mg/dL (ref 70–99)
POTASSIUM: 3.9 meq/L (ref 3.5–5.3)
Sodium: 140 mEq/L (ref 135–145)
TOTAL PROTEIN: 6.6 g/dL (ref 6.0–8.3)
Total Bilirubin: 0.8 mg/dL (ref 0.2–1.2)

## 2014-05-20 MED ORDER — VORTIOXETINE HBR 10 MG PO TABS: 10.0000 mg | ORAL_TABLET | Freq: Every morning | ORAL | Status: AC

## 2014-05-20 MED ORDER — HYDROXYZINE HCL 25 MG PO TABS: 25.0000 mg | ORAL_TABLET | Freq: Three times a day (TID) | ORAL | Status: DC | PRN

## 2014-05-20 NOTE — Assessment & Plan Note (Signed)
Pt blood pressure is better today.

## 2014-05-20 NOTE — Progress Notes (Signed)
   Subjective:    Patient ID: Jose Romero, male    DO: 1981/07/13, 33 y.o.   MRN: [id number]  HPI  Pt here for cpe.(Last time here to get established.)  Pt describes diet as moderate healthy most of the time for dinner and lunch. Sometimes skips breakfast. Pt does not exercise. 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 Pt does smoke 1-5 cigarettes a day. Pt used to smoke more but decreased by vaping. Pt sleeps about 6 hours at night.   Pt needs influenza and tetanus.  Pt is fasting today.  Pt still anxious but his mood was improved with sertraline. He has problems ejaculating with sertraline. He stopped it and he had successful ejaculation with intercourse.    Review of Systems  Constitutional: Negative for fever, chills and fatigue.  HENT: Negative for congestion, dental problem, ear pain, postnasal drip, rhinorrhea, sore throat and trouble swallowing.   Respiratory: Negative for cough, choking, chest tightness and wheezing.   Cardiovascular: Negative for chest pain and palpitations.  Gastrointestinal: Negative.   Genitourinary: Negative for dysuria, flank pain, decreased urine volume, difficulty urinating, genital sores, penile pain and testicular pain.  Musculoskeletal: Negative for arthralgias, back pain, joint swelling, myalgias and neck stiffness.  Psychiatric/Behavioral: Negative for suicidal ideas, hallucinations, behavioral problems, confusion, sleep disturbance, decreased concentration and agitation. The patient is nervous/anxious. The patient is not hyperactive.        Last time he took xanax was one year ago.(Then admits one week ago he borrowed one from friend when stressed) Pt used sertraline and he felt like helped but he had erectile dysfunction.  Pt stopped sertraline the other day due to ejaculation problems.       Objective:   Physical Exam  Constitutional: He is oriented to person, place, and time. He appears well-developed and well-nourished. No  distress.  HENT:  Head: Normocephalic and atraumatic.  Right Ear: External ear normal.  Left Ear: External ear normal.  Nose: Nose normal.  Mouth/Throat: Oropharynx is clear and moist. No oropharyngeal exudate.  Eyes: Conjunctivae and EOM are normal. Pupils are equal, round, and reactive to light.  Neck: Normal range of motion. Neck supple. No JVD present. No tracheal deviation present. No thyromegaly present.  Cardiovascular: Normal rate, regular rhythm and normal heart sounds.  Exam reveals no gallop and no friction rub.   No murmur heard. Pulmonary/Chest: Effort normal and breath sounds normal. No stridor. No respiratory distress. He has no wheezes. He has no rales. He exhibits no tenderness.  Abdominal: Soft. Bowel sounds are normal. He exhibits no distension and no mass. There is no tenderness. There is no rebound and no guarding.  Lymphadenopathy:    He has no cervical adenopathy.  Neurological: He is alert and oriented to person, place, and time. No cranial nerve deficit.  Skin: He is not diaphoretic.  Psychiatric: His behavior is normal. Judgment and thought content normal.  He appears to be anxious on exam.              Assessment & Plan:

## 2014-05-20 NOTE — Assessment & Plan Note (Signed)
I will prescribe him some brintellex today. And see how he does with this.  Stop the sertraline.

## 2014-05-20 NOTE — Assessment & Plan Note (Addendum)
Will prescribe him limited number of clonazapam(in future) and see if this will last him one month.(For prn severe anxiety)  Pt admits to borrowing xanax from friend one week ago. I advised him I can't prescribe in the future if he gets any controlled meds from any other source other than myself. In addition he needs to stop smoking marijuana if he were to under contract getting meds from me.   He needs to have negative drug screen before I prescribe any controlled medication.  I discussed above with pt. He is unwilling to stop marijuana use. He wants to be referred to psychiatrist.

## 2014-05-20 NOTE — Telephone Encounter (Signed)
Insurance will not cover brintillex, please prescribe something else.

## 2014-05-20 NOTE — Assessment & Plan Note (Signed)
Done today. Influenza and tdap given. Cmp, tsh, lipid, and cmp drawn today.

## 2014-05-20 NOTE — Patient Instructions (Signed)
I want you to fasting labs done today and we will call you with the results when those area in.   For your depression and anxiety. Stop sertraline and I sent brintillex to your pharmacy. I also sent hydroxyzine to your pharmacy.  Follow up in 2 wks or as needed.

## 2014-05-21 LAB — TSH: TSH: 0.69 u[IU]/mL (ref 0.350–4.500)

## 2014-05-21 NOTE — Telephone Encounter (Signed)
Pt could not tolerate sertraline due to inability to ejaculate. Also concerned for weight gain from ssri. Brintillix is not covered by insurance.I looked for coupon and could not find. Wellbutrin not option since he may have seizure disorder.  Since I am going to refer pt to psychiatrist for his anxiety will let psychiatrist hand depression as well. Will try to expidite referral.

## 2014-05-23 NOTE — Telephone Encounter (Signed)
See lab result note regarding this.

## 2014-05-24 MED ORDER — ESCITALOPRAM OXALATE 10 MG PO TABS: 10.0000 mg | ORAL_TABLET | Freq: Every day | ORAL | Status: DC

## 2014-05-24 NOTE — Telephone Encounter (Signed)
I did talk with pt. He wants to try lexapro. I explained to him it is a ssri as zoloft is and he may have same side effects of erectile dysfunction or similar side effects overall as well. He is willing to try. I will send his prescription in. If he does experience any advserse side effect stop it and let us know. He is willing to try before he gets in with psychiatrist.

## 2014-05-24 NOTE — Telephone Encounter (Signed)
Called pt, states he will call back to receive numbers to local psychiatrist

## 2014-06-07 ENCOUNTER — Telehealth: Payer: Self-pay | Admitting: Neurology

## 2014-06-07 ENCOUNTER — Ambulatory Visit: Payer: BC Managed Care – PPO | Admitting: Neurology

## 2014-06-07 NOTE — Telephone Encounter (Signed)
Pt no showed today's NP appt with Dr. Karel JarvisAquino. Referring provider's office has been notified via EPIC referral note.  Alcario DroughtErica - please send pt a no show letter / Oneita KrasSherri S.

## 2014-06-08 ENCOUNTER — Encounter: Payer: Self-pay | Admitting: *Deleted

## 2014-06-08 NOTE — Progress Notes (Unsigned)
No show letter sent for 06/07/2014 

## 2014-06-09 ENCOUNTER — Ambulatory Visit: Payer: BC Managed Care – PPO | Admitting: Medical

## 2014-06-09 DIAGNOSIS — Z0289 Encounter for other administrative examinations: Secondary | ICD-10-CM

## 2014-06-27 ENCOUNTER — Other Ambulatory Visit: Payer: Self-pay

## 2014-06-27 ENCOUNTER — Telehealth: Payer: Self-pay | Admitting: Medical

## 2014-06-27 MED ORDER — HYDROXYZINE HCL 25 MG PO TABS: 25.0000 mg | ORAL_TABLET | Freq: Three times a day (TID) | ORAL | Status: AC | PRN

## 2014-06-27 MED ORDER — ESCITALOPRAM OXALATE 10 MG PO TABS: 10.0000 mg | ORAL_TABLET | Freq: Every day | ORAL | Status: AC

## 2014-06-27 NOTE — Telephone Encounter (Signed)
Pt is needing new rx for escitalopram (LEXAPRO) 10 MG tablet and hydroxyzine (ataraxvistaril) 25 mg, send to [facility].

## 2014-06-27 NOTE — Telephone Encounter (Signed)
Medication refilled per patient request.  

## 2015-08-29 IMAGING — CT CT ABD-PELV W/ CM
1 of 2 series · 16 of 32 positions shown, 20 images · IV contrast (OMNIPAQUE 300)
Comparison: None.

CLINICAL DATA: Pain

EXAM:
CT ABDOMEN AND PELVIS WITH CONTRAST
TECHNIQUE: Multidetector CT imaging of the abdomen and pelvis was performed
using the standard protocol following bolus administration of
intravenous contrast.
CONTRAST:  80mL OMNIPAQUE IOHEXOL 300 MG/ML  SOLN

[Series 2: abd/pel with · axial · 0.82mm/px · z∈[+1063,+1523]mm · 16 of 102 slices shown, 20 images]
[im 5/102  soft-tissue]
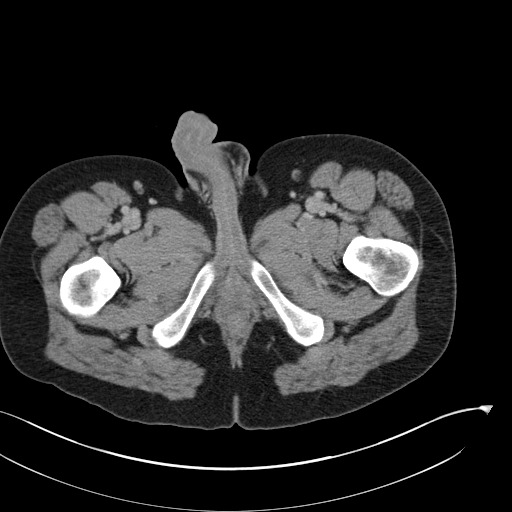
[im 5/102  bone]
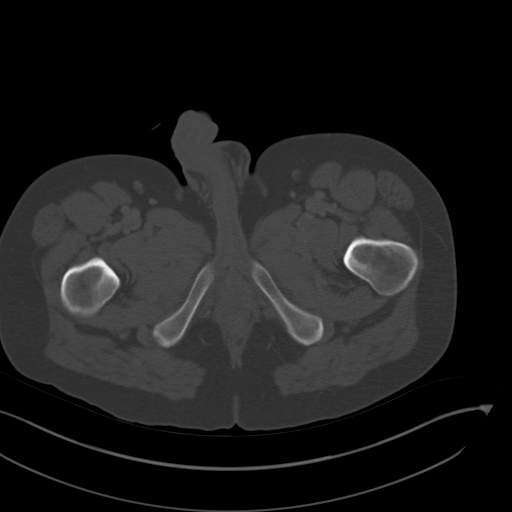
[im 13/102  soft-tissue]
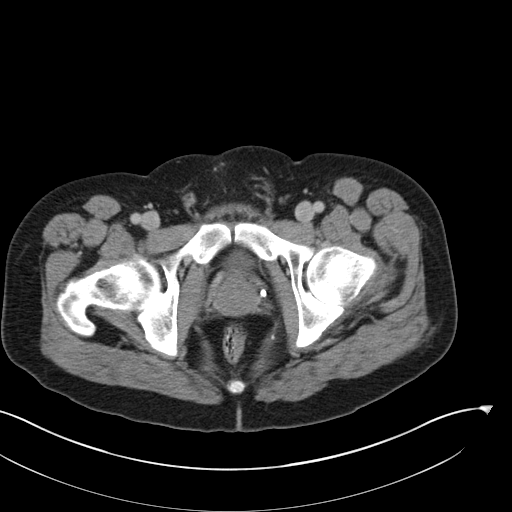
[im 21/102  soft-tissue]
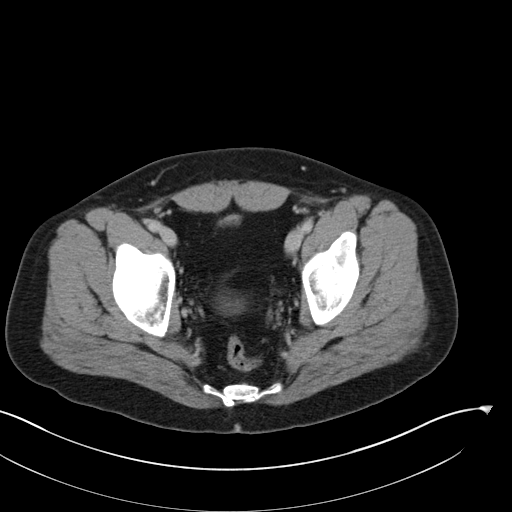
[im 29/102  soft-tissue]
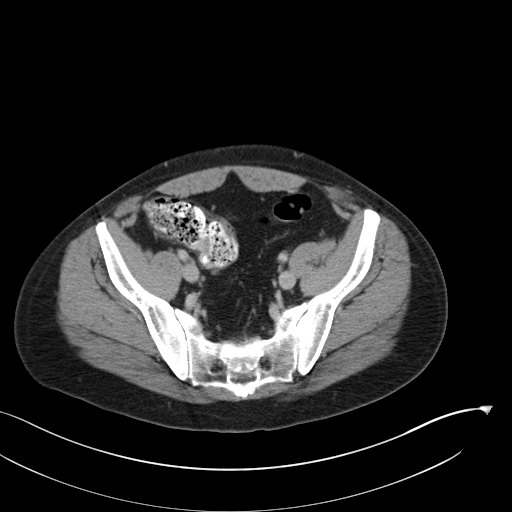
[im 33/102  soft-tissue]
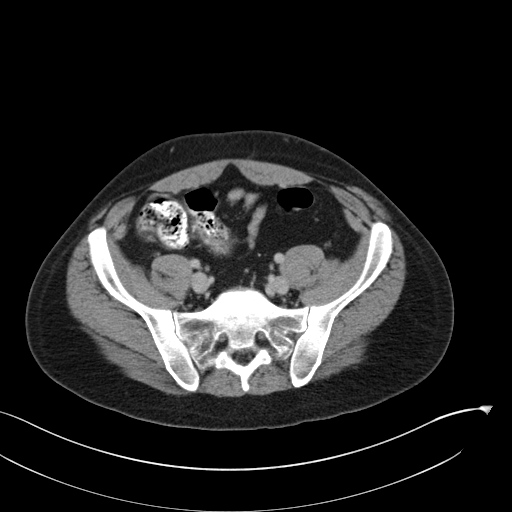
[im 41/102  soft-tissue]
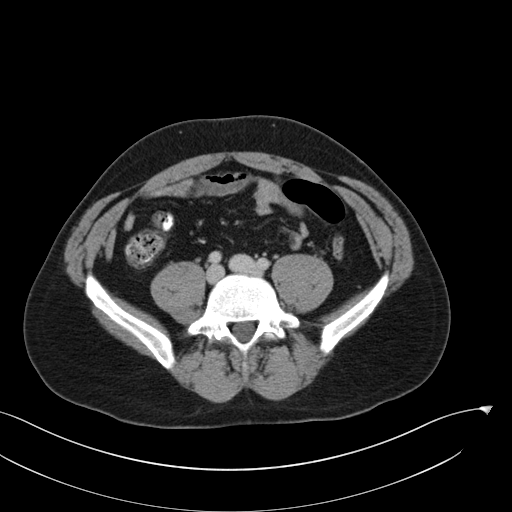
[im 49/102  soft-tissue]
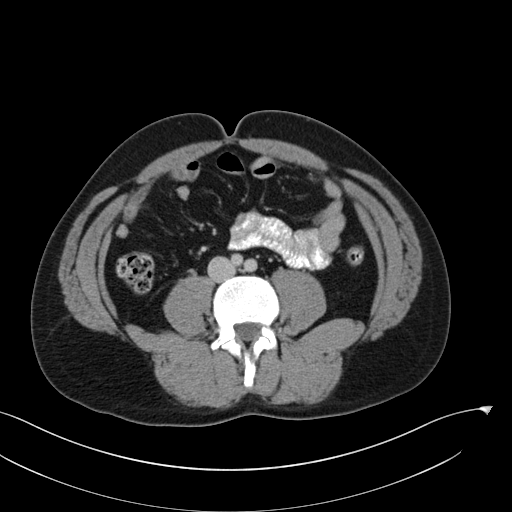
[im 53/102  soft-tissue]
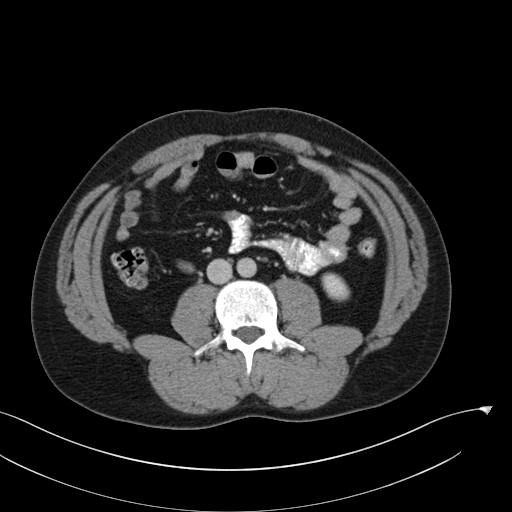
[im 61/102  soft-tissue]
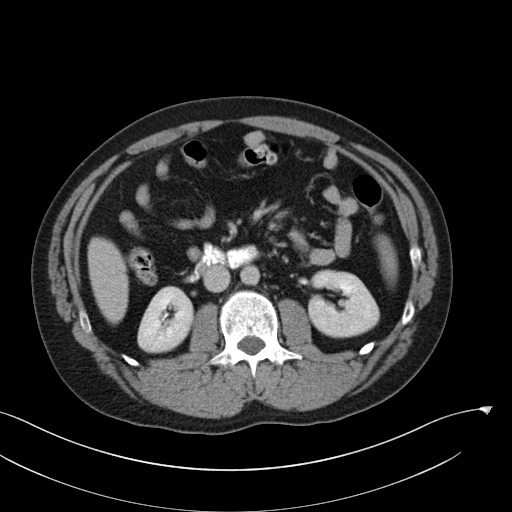
[im 61/102  bone]
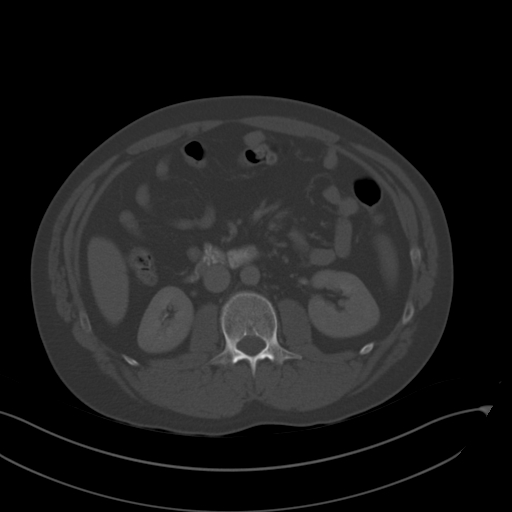
[im 69/102  soft-tissue]
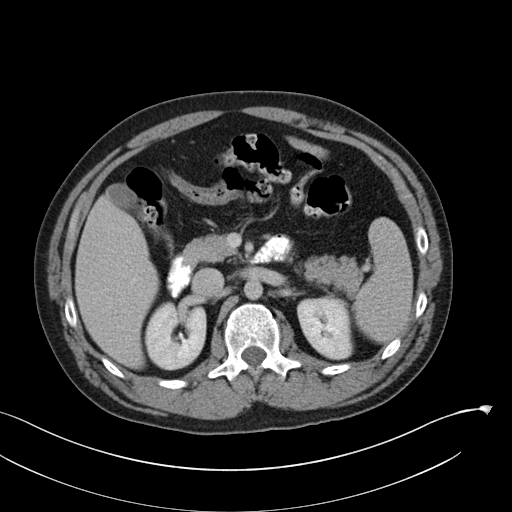
[im 77/102  soft-tissue]
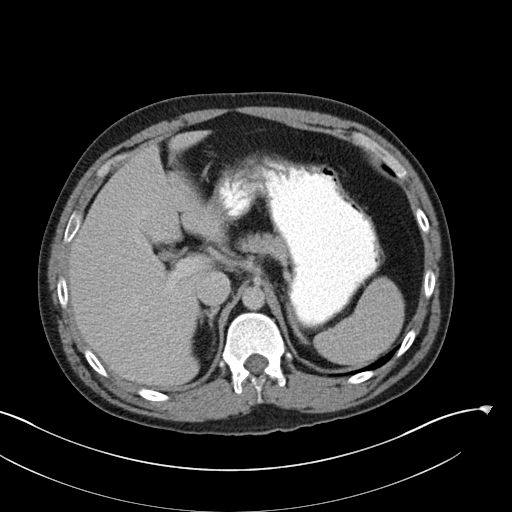
[im 81/102  soft-tissue]
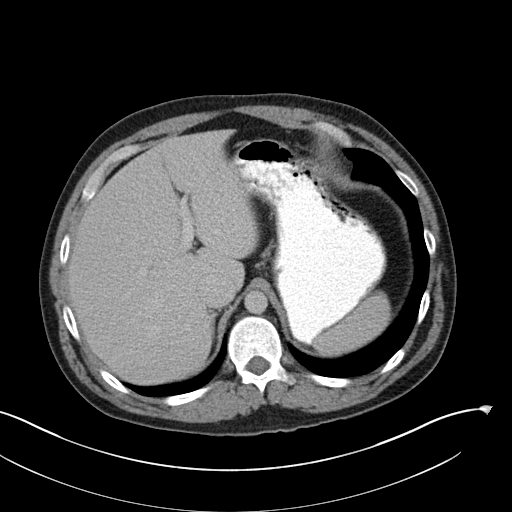
[im 85/102  lung]
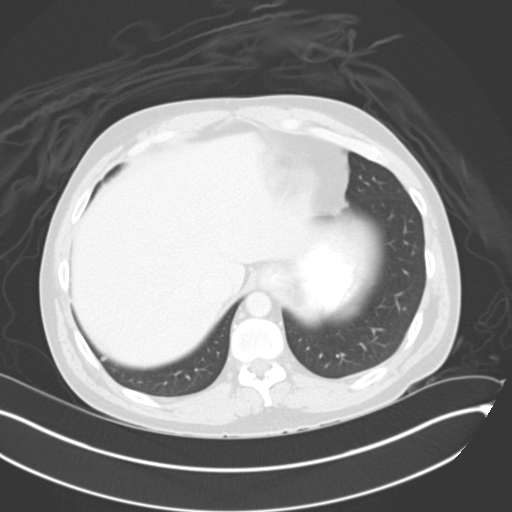
[im 89/102  soft-tissue]
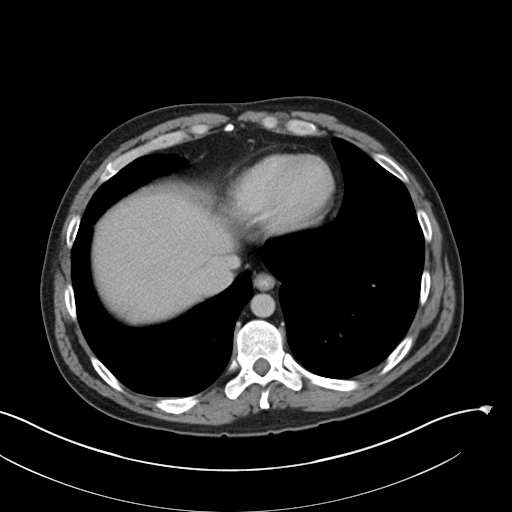
[im 89/102  lung]
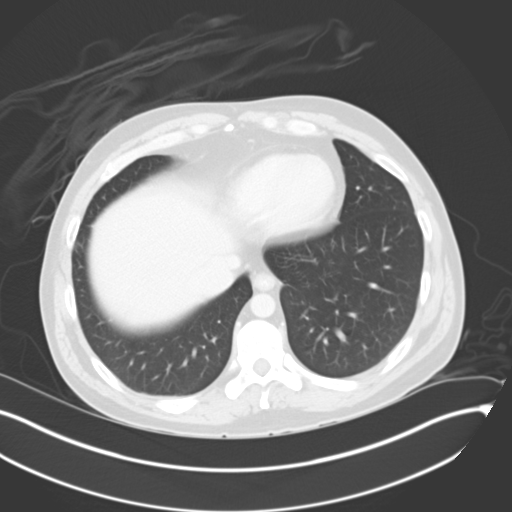
[im 93/102  lung]
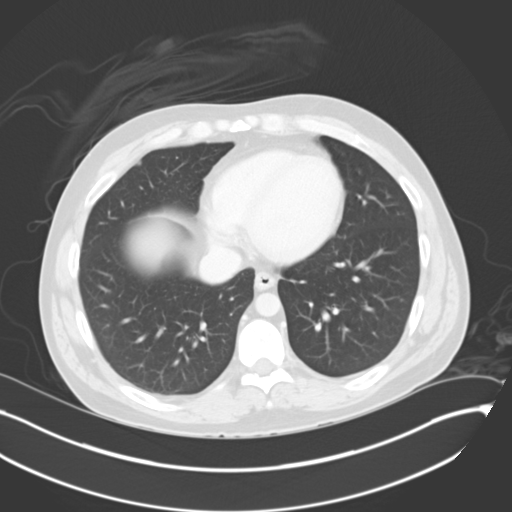
[im 97/102  soft-tissue]
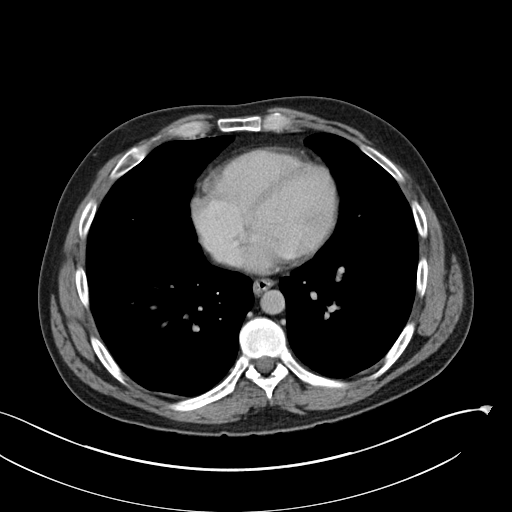
[im 97/102  lung]
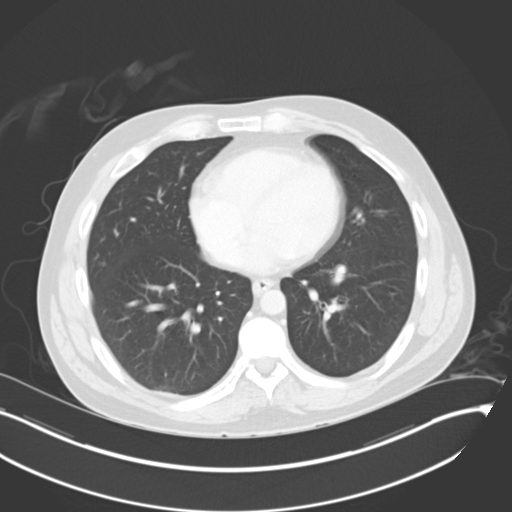

[16 of 32 positions shown; findings below may reference images not displayed]

FINDINGS: The lung bases are unremarkable.

The liver, spleen, adrenals, pancreas, kidneys are unremarkable.
There is no evidence of bowel obstruction, enteritis, nor colitis.
The appendix is identified and is unremarkable. There is no evidence
of abdominal or pelvic free fluid, loculated fluid collections,
masses nor adenopathy. There is no evidence of abdominal aortic
aneurysm.
IMPRESSION: No evidence of obstructive or inflammatory abnormalities.
